# Patient Record
Sex: Female | Born: 1975 | Race: Black or African American | Hispanic: No | Marital: Single | Smoking: Never smoker
Health system: Southern US, Community
[De-identification: ages and names within clinical notes are randomized; demographics above are authoritative.]

## PROBLEM LIST (undated history)

## (undated) DIAGNOSIS — B009 Herpesviral infection, unspecified: Secondary | ICD-10-CM

## (undated) DIAGNOSIS — J45909 Unspecified asthma, uncomplicated: Secondary | ICD-10-CM

## (undated) HISTORY — PX: CERVIX LESION DESTRUCTION: SHX591

## (undated) HISTORY — PX: BUNIONECTOMY: SHX129

---

## 1997-10-25 ENCOUNTER — Emergency Department (HOSPITAL_COMMUNITY): Admission: EM | Admit: 1997-10-25 | Discharge: 1997-10-25 | Payer: Self-pay | Admitting: Emergency Medicine

## 1998-04-19 ENCOUNTER — Emergency Department (HOSPITAL_COMMUNITY): Admission: EM | Admit: 1998-04-19 | Discharge: 1998-04-19 | Payer: Self-pay | Admitting: Emergency Medicine

## 1998-04-27 ENCOUNTER — Ambulatory Visit (HOSPITAL_COMMUNITY): Admission: RE | Admit: 1998-04-27 | Discharge: 1998-04-27 | Payer: Self-pay

## 1998-05-25 ENCOUNTER — Other Ambulatory Visit: Admission: RE | Admit: 1998-05-25 | Discharge: 1998-05-25 | Payer: Self-pay | Admitting: *Deleted

## 1999-08-20 ENCOUNTER — Other Ambulatory Visit: Admission: RE | Admit: 1999-08-20 | Discharge: 1999-08-20 | Payer: Self-pay | Admitting: *Deleted

## 1999-08-20 ENCOUNTER — Encounter (INDEPENDENT_AMBULATORY_CARE_PROVIDER_SITE_OTHER): Payer: Self-pay

## 2000-10-05 ENCOUNTER — Other Ambulatory Visit: Admission: RE | Admit: 2000-10-05 | Discharge: 2000-10-05 | Payer: Self-pay | Admitting: *Deleted

## 2000-10-27 ENCOUNTER — Other Ambulatory Visit: Admission: RE | Admit: 2000-10-27 | Discharge: 2000-10-27 | Payer: Self-pay | Admitting: *Deleted

## 2001-03-08 ENCOUNTER — Other Ambulatory Visit: Admission: RE | Admit: 2001-03-08 | Discharge: 2001-03-08 | Payer: Self-pay | Admitting: *Deleted

## 2001-07-17 ENCOUNTER — Other Ambulatory Visit: Admission: RE | Admit: 2001-07-17 | Discharge: 2001-07-17 | Payer: Self-pay | Admitting: *Deleted

## 2002-08-19 ENCOUNTER — Other Ambulatory Visit: Admission: RE | Admit: 2002-08-19 | Discharge: 2002-08-19 | Payer: Self-pay | Admitting: *Deleted

## 2003-08-12 ENCOUNTER — Other Ambulatory Visit: Admission: RE | Admit: 2003-08-12 | Discharge: 2003-08-12 | Payer: Self-pay | Admitting: *Deleted

## 2004-08-07 ENCOUNTER — Emergency Department (HOSPITAL_COMMUNITY): Admission: EM | Admit: 2004-08-07 | Discharge: 2004-08-07 | Payer: Self-pay | Admitting: Emergency Medicine

## 2004-08-24 ENCOUNTER — Other Ambulatory Visit: Admission: RE | Admit: 2004-08-24 | Discharge: 2004-08-24 | Payer: Self-pay | Admitting: *Deleted

## 2005-08-31 ENCOUNTER — Other Ambulatory Visit: Admission: RE | Admit: 2005-08-31 | Discharge: 2005-08-31 | Payer: Self-pay | Admitting: *Deleted

## 2006-08-29 ENCOUNTER — Other Ambulatory Visit: Admission: RE | Admit: 2006-08-29 | Discharge: 2006-08-29 | Payer: Self-pay | Admitting: *Deleted

## 2007-08-13 ENCOUNTER — Other Ambulatory Visit: Admission: RE | Admit: 2007-08-13 | Discharge: 2007-08-13 | Payer: Self-pay | Admitting: *Deleted

## 2007-09-26 ENCOUNTER — Emergency Department (HOSPITAL_COMMUNITY): Admission: EM | Admit: 2007-09-26 | Discharge: 2007-09-26 | Payer: Self-pay | Admitting: Emergency Medicine

## 2012-07-11 NOTE — L&D Delivery Note (Signed)
Operative Delivery Note At 2:36 PM a viable female was delivered via Vaginal, Vacuum Investment banker, operational).  Presentation: vertex; Position: Occiput,, Anterior; Station: +3. VE indicated for bradycardia at the time of crowning   Verbal consent: obtained from patient.  Risks and benefits discussed in detail.  Risks include, but are not limited to the risks of anesthesia, bleeding, infection, damage to maternal tissues, fetal cephalhematoma.  There is also the risk of inability to effect vaginal delivery of the head, or shoulder dystocia that cannot be resolved by established maneuvers, leading to the need for emergency cesarean section.  APGAR: , ; weight .   Placenta status: , .  Spont>>intact Cord:  with the following complications:tight nuchal cord X 1, clamped + cut .  Cord pH: 7.22  Anesthesia:  Loc + epid Instruments: KIWI Episiotomy: none Lacerations: sec deg Suture Repair: 3.0 vicryl rapide Est. Blood Loss (mL): 300   NICU called to assist w/ infant resus  Mom to postpartum.  Baby to nursery-stable.  Meriel Pica 05/05/2013, 2:48 PM

## 2012-10-17 LAB — OB RESULTS CONSOLE ABO/RH: RH Type: POSITIVE

## 2012-10-17 LAB — OB RESULTS CONSOLE RUBELLA ANTIBODY, IGM: Rubella: IMMUNE

## 2012-10-17 LAB — OB RESULTS CONSOLE HEPATITIS B SURFACE ANTIGEN: Hepatitis B Surface Ag: NEGATIVE

## 2012-10-17 LAB — OB RESULTS CONSOLE HIV ANTIBODY (ROUTINE TESTING): HIV: NONREACTIVE

## 2012-10-17 LAB — OB RESULTS CONSOLE ANTIBODY SCREEN: Antibody Screen: NEGATIVE

## 2013-05-05 ENCOUNTER — Inpatient Hospital Stay (HOSPITAL_COMMUNITY)
Admission: AD | Admit: 2013-05-05 | Discharge: 2013-05-07 | DRG: 775 | Disposition: A | Discharge status: Home / Self Care | Payer: 59 | Source: Ambulatory Visit | Attending: Obstetrics and Gynecology | Admitting: Obstetrics and Gynecology

## 2013-05-05 ENCOUNTER — Inpatient Hospital Stay (HOSPITAL_COMMUNITY): Payer: 59 | Admitting: Anesthesiology

## 2013-05-05 ENCOUNTER — Encounter (HOSPITAL_COMMUNITY): Payer: 59 | Admitting: Anesthesiology

## 2013-05-05 ENCOUNTER — Encounter (HOSPITAL_COMMUNITY): Payer: Self-pay | Admitting: *Deleted

## 2013-05-05 DIAGNOSIS — O09519 Supervision of elderly primigravida, unspecified trimester: Secondary | ICD-10-CM | POA: Diagnosis present

## 2013-05-05 DIAGNOSIS — Z22330 Carrier of Group B streptococcus: Secondary | ICD-10-CM

## 2013-05-05 DIAGNOSIS — O99892 Other specified diseases and conditions complicating childbirth: Secondary | ICD-10-CM | POA: Diagnosis present

## 2013-05-05 HISTORY — DX: Unspecified asthma, uncomplicated: J45.909

## 2013-05-05 HISTORY — DX: Herpesviral infection, unspecified: B00.9

## 2013-05-05 LAB — CBC
HCT: 35.4 % — ABNORMAL LOW (ref 36.0–46.0)
Hemoglobin: 12.3 g/dL (ref 12.0–15.0)
MCH: 31.9 pg (ref 26.0–34.0)
MCHC: 34.7 g/dL (ref 30.0–36.0)
MCV: 91.9 fL (ref 78.0–100.0)
Platelets: 339 10*3/uL (ref 150–400)
RDW: 13.4 % (ref 11.5–15.5)
WBC: 10.4 10*3/uL (ref 4.0–10.5)

## 2013-05-05 LAB — COMPREHENSIVE METABOLIC PANEL
ALT: 9 U/L (ref 0–35)
AST: 16 U/L (ref 0–37)
Albumin: 2.9 g/dL — ABNORMAL LOW (ref 3.5–5.2)
Alkaline Phosphatase: 142 U/L — ABNORMAL HIGH (ref 39–117)
BUN: 7 mg/dL (ref 6–23)
Calcium: 9.3 mg/dL (ref 8.4–10.5)
Potassium: 3.5 mEq/L (ref 3.5–5.1)
Sodium: 135 mEq/L (ref 135–145)
Total Bilirubin: 0.3 mg/dL (ref 0.3–1.2)
Total Protein: 6.9 g/dL (ref 6.0–8.3)

## 2013-05-05 LAB — TYPE AND SCREEN: ABO/RH(D): B POS

## 2013-05-05 LAB — ABO/RH: ABO/RH(D): B POS

## 2013-05-05 MED ORDER — LIDOCAINE HCL (PF) 1 % IJ SOLN
INTRAMUSCULAR | Status: DC | PRN
  Administered 2013-05-05 (×2): 9 mL

## 2013-05-05 MED ORDER — EPHEDRINE 5 MG/ML INJ
10.0000 mg | INTRAVENOUS | Status: DC | PRN
  Filled 2013-05-05: qty 2
  Filled 2013-05-05: qty 4

## 2013-05-05 MED ORDER — WITCH HAZEL-GLYCERIN EX PADS: 1.0000 "application " | MEDICATED_PAD | CUTANEOUS | Status: DC | PRN

## 2013-05-05 MED ORDER — OXYTOCIN 40 UNITS IN LACTATED RINGERS INFUSION - SIMPLE MED
1.0000 m[IU]/min | INTRAVENOUS | Status: DC
  Administered 2013-05-05: 1 m[IU]/min via INTRAVENOUS
  Filled 2013-05-05: qty 1000

## 2013-05-05 MED ORDER — PENICILLIN G POTASSIUM 5000000 UNITS IJ SOLR
5.0000 10*6.[IU] | Freq: Once | INTRAVENOUS | Status: AC
  Administered 2013-05-05: 5 10*6.[IU] via INTRAVENOUS
  Filled 2013-05-05: qty 5

## 2013-05-05 MED ORDER — DIPHENHYDRAMINE HCL 25 MG PO CAPS: 25.0000 mg | ORAL_CAPSULE | Freq: Four times a day (QID) | ORAL | Status: DC | PRN

## 2013-05-05 MED ORDER — DIPHENHYDRAMINE HCL 50 MG/ML IJ SOLN: 12.5000 mg | INTRAMUSCULAR | Status: DC | PRN

## 2013-05-05 MED ORDER — FENTANYL 2.5 MCG/ML BUPIVACAINE 1/10 % EPIDURAL INFUSION (WH - ANES)
INTRAMUSCULAR | Status: DC | PRN
  Administered 2013-05-05: 14 mL/h via EPIDURAL

## 2013-05-05 MED ORDER — PHENYLEPHRINE 40 MCG/ML (10ML) SYRINGE FOR IV PUSH (FOR BLOOD PRESSURE SUPPORT)
80.0000 ug | PREFILLED_SYRINGE | INTRAVENOUS | Status: DC | PRN
  Filled 2013-05-05: qty 2
  Filled 2013-05-05: qty 5

## 2013-05-05 MED ORDER — MEASLES, MUMPS & RUBELLA VAC ~~LOC~~ INJ
0.5000 mL | INJECTION | Freq: Once | SUBCUTANEOUS | Status: DC
  Filled 2013-05-05: qty 0.5

## 2013-05-05 MED ORDER — SIMETHICONE 80 MG PO CHEW: 80.0000 mg | CHEWABLE_TABLET | ORAL | Status: DC | PRN

## 2013-05-05 MED ORDER — OXYCODONE-ACETAMINOPHEN 5-325 MG PO TABS: 1.0000 | ORAL_TABLET | ORAL | Status: DC | PRN

## 2013-05-05 MED ORDER — BISACODYL 10 MG RE SUPP: 10.0000 mg | Freq: Every day | RECTAL | Status: DC | PRN

## 2013-05-05 MED ORDER — LANOLIN HYDROUS EX OINT: TOPICAL_OINTMENT | CUTANEOUS | Status: DC | PRN

## 2013-05-05 MED ORDER — LIDOCAINE HCL (PF) 1 % IJ SOLN
30.0000 mL | INTRAMUSCULAR | Status: DC | PRN
  Administered 2013-05-05: 30 mL via SUBCUTANEOUS
  Filled 2013-05-05 (×2): qty 30

## 2013-05-05 MED ORDER — EPHEDRINE 5 MG/ML INJ
10.0000 mg | INTRAVENOUS | Status: DC | PRN
  Filled 2013-05-05: qty 2

## 2013-05-05 MED ORDER — ACETAMINOPHEN 325 MG PO TABS: 650.0000 mg | ORAL_TABLET | ORAL | Status: DC | PRN

## 2013-05-05 MED ORDER — PHENYLEPHRINE 40 MCG/ML (10ML) SYRINGE FOR IV PUSH (FOR BLOOD PRESSURE SUPPORT)
80.0000 ug | PREFILLED_SYRINGE | INTRAVENOUS | Status: DC | PRN
  Filled 2013-05-05: qty 2

## 2013-05-05 MED ORDER — SENNOSIDES-DOCUSATE SODIUM 8.6-50 MG PO TABS
2.0000 | ORAL_TABLET | ORAL | Status: DC
  Administered 2013-05-06 – 2013-05-07 (×2): 2 via ORAL
  Filled 2013-05-05 (×2): qty 2

## 2013-05-05 MED ORDER — ONDANSETRON HCL 4 MG/2ML IJ SOLN: 4.0000 mg | INTRAMUSCULAR | Status: DC | PRN

## 2013-05-05 MED ORDER — LACTATED RINGERS IV SOLN: 500.0000 mL | INTRAVENOUS | Status: DC | PRN

## 2013-05-05 MED ORDER — TETANUS-DIPHTH-ACELL PERTUSSIS 5-2.5-18.5 LF-MCG/0.5 IM SUSP: 0.5000 mL | Freq: Once | INTRAMUSCULAR | Status: DC

## 2013-05-05 MED ORDER — PENICILLIN G POTASSIUM 5000000 UNITS IJ SOLR
2.5000 10*6.[IU] | INTRAMUSCULAR | Status: DC
  Administered 2013-05-05: 2.5 10*6.[IU] via INTRAVENOUS
  Filled 2013-05-05 (×5): qty 2.5

## 2013-05-05 MED ORDER — IBUPROFEN 800 MG PO TABS
800.0000 mg | ORAL_TABLET | Freq: Three times a day (TID) | ORAL | Status: DC | PRN
  Administered 2013-05-05 – 2013-05-07 (×4): 800 mg via ORAL
  Filled 2013-05-05 (×4): qty 1

## 2013-05-05 MED ORDER — DIBUCAINE 1 % RE OINT: 1.0000 "application " | TOPICAL_OINTMENT | RECTAL | Status: DC | PRN

## 2013-05-05 MED ORDER — PRENATAL MULTIVITAMIN CH
1.0000 | ORAL_TABLET | Freq: Every day | ORAL | Status: DC
  Administered 2013-05-06: 1 via ORAL
  Filled 2013-05-05: qty 1

## 2013-05-05 MED ORDER — ZOLPIDEM TARTRATE 5 MG PO TABS: 5.0000 mg | ORAL_TABLET | Freq: Every evening | ORAL | Status: DC | PRN

## 2013-05-05 MED ORDER — IBUPROFEN 600 MG PO TABS: 600.0000 mg | ORAL_TABLET | Freq: Four times a day (QID) | ORAL | Status: DC | PRN

## 2013-05-05 MED ORDER — FLEET ENEMA 7-19 GM/118ML RE ENEM: 1.0000 | ENEMA | RECTAL | Status: DC | PRN

## 2013-05-05 MED ORDER — OXYTOCIN BOLUS FROM INFUSION: 500.0000 mL | INTRAVENOUS | Status: DC

## 2013-05-05 MED ORDER — CITRIC ACID-SODIUM CITRATE 334-500 MG/5ML PO SOLN: 30.0000 mL | ORAL | Status: DC | PRN

## 2013-05-05 MED ORDER — BENZOCAINE-MENTHOL 20-0.5 % EX AERO
1.0000 "application " | INHALATION_SPRAY | CUTANEOUS | Status: DC | PRN
  Administered 2013-05-05: 1 via TOPICAL
  Filled 2013-05-05: qty 56

## 2013-05-05 MED ORDER — ONDANSETRON HCL 4 MG/2ML IJ SOLN: 4.0000 mg | Freq: Four times a day (QID) | INTRAMUSCULAR | Status: DC | PRN

## 2013-05-05 MED ORDER — OXYCODONE-ACETAMINOPHEN 5-325 MG PO TABS: 1.0000 | ORAL_TABLET | Freq: Four times a day (QID) | ORAL | Status: DC | PRN

## 2013-05-05 MED ORDER — FENTANYL 2.5 MCG/ML BUPIVACAINE 1/10 % EPIDURAL INFUSION (WH - ANES)
14.0000 mL/h | INTRAMUSCULAR | Status: DC | PRN
  Filled 2013-05-05 (×2): qty 125

## 2013-05-05 MED ORDER — LACTATED RINGERS IV SOLN
INTRAVENOUS | Status: DC
  Administered 2013-05-05: 05:00:00 via INTRAVENOUS

## 2013-05-05 MED ORDER — LACTATED RINGERS IV SOLN
500.0000 mL | Freq: Once | INTRAVENOUS | Status: AC
  Administered 2013-05-05: 1000 mL via INTRAVENOUS

## 2013-05-05 MED ORDER — FLEET ENEMA 7-19 GM/118ML RE ENEM: 1.0000 | ENEMA | Freq: Every day | RECTAL | Status: DC | PRN

## 2013-05-05 MED ORDER — OXYTOCIN 40 UNITS IN LACTATED RINGERS INFUSION - SIMPLE MED
62.5000 mL/h | INTRAVENOUS | Status: DC
Start: 2013-05-05 — End: 2013-05-05

## 2013-05-05 MED ORDER — ONDANSETRON HCL 4 MG PO TABS: 4.0000 mg | ORAL_TABLET | ORAL | Status: DC | PRN

## 2013-05-05 MED ORDER — TERBUTALINE SULFATE 1 MG/ML IJ SOLN: 0.2500 mg | Freq: Once | INTRAMUSCULAR | Status: DC | PRN

## 2013-05-05 NOTE — Progress Notes (Signed)
Now 6-7/ AROM>>>clear, FHR cat 1

## 2013-05-05 NOTE — Progress Notes (Signed)
Report called to Dana RN in BS. Pt to BS via w/c 

## 2013-05-05 NOTE — Progress Notes (Signed)
Dr Marcelle Overlie notified of pt's admission and status. 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 Orders received.

## 2013-05-05 NOTE — H&P (Signed)
Mallory Pham is a 37 y.o. female presenting for SOL. Maternal Medical History:  Reason for admission: Contractions.   Contractions: Onset was 6-12 hours ago.   Frequency: regular.   Perceived severity is moderate.    Fetal activity: Perceived fetal activity is normal.   Last perceived fetal movement was within the past hour.      OB History   Grav Para Term Preterm Abortions TAB SAB Ect Mult Living   1              Past Medical History  Diagnosis Date  . Asthma   . Herpes    Past Surgical History  Procedure Laterality Date  . Cervix lesion destruction    . Bunionectomy      Left foot   Family History: family history includes Cancer in her maternal grandmother; Heart disease in her maternal aunt. Social History:  reports that she has never smoked. She does not have any smokeless tobacco history on file. She reports that she does not drink alcohol or use illicit drugs.   Prenatal Transfer Tool  Maternal Diabetes: No Genetic Screening: Normal Maternal Ultrasounds/Referrals: Normal Fetal Ultrasounds or other Referrals:  None Maternal Substance Abuse:  No Significant Maternal Medications:  None Significant Maternal Lab Results:  None Other Comments:  None  ROS  Dilation: 6.5 Effacement (%): 100 Station: 0 Exam by:: Felkelrn Blood pressure 146/94, pulse 89, temperature 98.4 F (36.9 C), temperature source Oral, resp. rate 20, height 5\' 2"  (1.575 m), weight 218 lb 12.8 oz (99.247 kg), SpO2 98.00%. Maternal Exam:  Uterine Assessment: Contraction strength is moderate.  Contraction frequency is regular.   Abdomen: Patient reports no abdominal tenderness. Fundal height is term FH.   Estimated fetal weight is AGA.   Fetal presentation: vertex  Introitus: Normal vulva. Normal vagina.  Pelvis: adequate for delivery.      Physical Exam  Constitutional: She is oriented to person, place, and time. She appears well-developed and well-nourished.  HENT:  Head:  Normocephalic and atraumatic.  Neck: Normal range of motion. Neck supple.  Cardiovascular: Normal rate and regular rhythm.   Respiratory: Effort normal and breath sounds normal.  GI:  Term FH, FHR 138  Genitourinary:  Vulva NEG, cx 3-4 on adm  Musculoskeletal: Normal range of motion.  Neurological: She is alert and oriented to person, place, and time.    Prenatal labs: ABO, Rh: --/--/B POS (10/26 0500) Antibody: NEG (10/26 0500) Rubella: Immune (04/09 0000) RPR: Nonreactive (04/09 0000)  HBsAg: Negative (04/09 0000)  HIV: Non-reactive (04/09 0000)  GBS: Positive (10/03 0000)   Assessment/Plan: Term IUP, SOL, past hx HSV, on daily Valtrex, no current sx, hx + GBS>>antibiotics   Cassian Torelli M 05/05/2013, 9:01 AM

## 2013-05-05 NOTE — MAU Note (Signed)
Pt states has been on Keflex for small area L middle finger that came to head and burst and then would not heal. Pt has bandaid on finger.

## 2013-05-05 NOTE — Anesthesia Procedure Notes (Signed)
Epidural Patient location during procedure: OB Start time: 05/05/2013 6:30 AM End time: 05/05/2013 6:34 AM  Staffing Anesthesiologist: Leilani Able  Preanesthetic Checklist Completed: patient identified, surgical consent, pre-op evaluation, timeout performed, IV checked, risks and benefits discussed and monitors and equipment checked  Epidural Patient position: sitting Prep: site prepped and draped and DuraPrep Patient monitoring: continuous pulse ox and blood pressure Approach: midline Injection technique: LOR air  Needle:  Needle type: Tuohy  Needle gauge: 17 G Needle length: 9 cm and 9 Needle insertion depth: 7 cm Catheter type: closed end flexible Catheter size: 19 Gauge Catheter at skin depth: 13 cm Test dose: negative and Other  Assessment Sensory level: T9 Events: blood not aspirated, injection not painful, no injection resistance, negative IV test and no paresthesia  Additional Notes Reason for block:procedure for pain

## 2013-05-05 NOTE — MAU Note (Signed)
Contractions since 2400. Some brownish d/c

## 2013-05-05 NOTE — Anesthesia Preprocedure Evaluation (Signed)
Anesthesia Evaluation  Patient identified by MRN, date of birth, ID band Patient awake    Reviewed: Allergy & Precautions, H&P , NPO status , Patient's Chart, lab work & pertinent test results  Airway Mallampati: II TM Distance: >3 FB Neck ROM: full    Dental no notable dental hx.    Pulmonary    Pulmonary exam normal       Cardiovascular negative cardio ROS      Neuro/Psych negative neurological ROS  negative psych ROS   GI/Hepatic negative GI ROS, Neg liver ROS,   Endo/Other  Morbid obesity  Renal/GU negative Renal ROS     Musculoskeletal   Abdominal (+) + obese,   Peds  Hematology negative hematology ROS (+)   Anesthesia Other Findings   Reproductive/Obstetrics (+) Pregnancy                           Anesthesia Physical Anesthesia Plan  ASA: III  Anesthesia Plan: Epidural   Post-op Pain Management:    Induction:   Airway Management Planned:   Additional Equipment:   Intra-op Plan:   Post-operative Plan:   Informed Consent: I have reviewed the patients History and Physical, chart, labs and discussed the procedure including the risks, benefits and alternatives for the proposed anesthesia with the patient or authorized representative who has indicated his/her understanding and acceptance.     Plan Discussed with:   Anesthesia Plan Comments:         Anesthesia Quick Evaluation  

## 2013-05-06 LAB — CBC
MCH: 31.8 pg (ref 26.0–34.0)
MCV: 92.4 fL (ref 78.0–100.0)
Platelets: 309 10*3/uL (ref 150–400)
RBC: 3.3 MIL/uL — ABNORMAL LOW (ref 3.87–5.11)

## 2013-05-06 NOTE — Progress Notes (Signed)
Post Partum Day 1 Subjective: no complaints, up ad lib, voiding and tolerating PO  Objective: Blood pressure 127/86, pulse 75, temperature 98.1 F (36.7 C), temperature source Oral, resp. rate 18, height 5\' 2"  (1.575 m), weight 218 lb 12.8 oz (99.247 kg), SpO2 84.00%, unknown if currently breastfeeding.  Physical Exam:  General: alert and cooperative Lochia: appropriate Uterine Fundus: firm Incision: perineum intact, no labial edema noted DVT Evaluation: No evidence of DVT seen on physical exam. Negative Homan's sign. No cords or calf tenderness.   Recent Labs  05/05/13 0500 05/06/13 0605  HGB 12.3 10.5*  HCT 35.4* 30.5*    Assessment/Plan: Plan for discharge tomorrow   LOS: 1 day   Anjana Cheek G 05/06/2013, 8:23 AM

## 2013-05-06 NOTE — Lactation Note (Signed)
This note was copied from the chart of [facility]. Lactation Consultation Note  Mom reports breastfeeding is going well.  Reviewed basics, mom knows to re latch baby if nipple hurts. Teach back regarding feeding cues and proper latch done.  Hand expression demonstrated with colostrum visible. Mom to call for help as needed.   Patient Name: Mallory Pham [id number] Date: 05/06/2013 Reason for consult: Follow-up assessment   Maternal Data    Feeding Feeding Type: Breast Fed Length of feed: 30 min  LATCH Score/Interventions Latch: Grasps breast easily, tongue down, lips flanged, rhythmical sucking.  Audible Swallowing: A few with stimulation  Type of Nipple: Everted at rest and after stimulation  Comfort (Breast/Nipple): Soft / non-tender     Hold (Positioning): No assistance needed to correctly position infant at breast.  LATCH Score: 9  Lactation Tools Discussed/Used     Consult Status Consult Status: Follow-up Date: 05/07/13 Follow-up type: In-patient    Tricia Pledger, Arvella Merles 05/06/2013, 5:46 PM

## 2013-05-06 NOTE — Anesthesia Postprocedure Evaluation (Signed)
  Anesthesia Post-op Note  Patient: Mallory Pham  Procedure(s) Performed: * No procedures listed *  Patient Location: PACU and Mother/Baby  Anesthesia Type:Epidural  Level of Consciousness: awake, alert  and oriented  Airway and Oxygen Therapy: Patient Spontanous Breathing  Post-op Pain: none  Post-op Assessment: Post-op Vital signs reviewed, Patient's Cardiovascular Status Stable, No headache, No backache, No residual numbness and No residual motor weakness  Post-op Vital Signs: Reviewed and stable  Complications: No apparent anesthesia complications

## 2013-05-07 MED ORDER — IBUPROFEN 800 MG PO TABS: 800.0000 mg | ORAL_TABLET | Freq: Three times a day (TID) | ORAL | Status: DC | PRN

## 2013-05-07 NOTE — Progress Notes (Signed)
UR chart review completed.  

## 2013-05-07 NOTE — Discharge Summary (Signed)
Obstetric Discharge Summary Reason for Admission: onset of labor Prenatal Procedures: ultrasound Intrapartum Procedures: vacuum Postpartum Procedures: none Complications-Operative and Postpartum: 2 degree perineal laceration Hemoglobin  Date Value Range Status  05/06/2013 10.5* 12.0 - 15.0 g/dL Final     HCT  Date Value Range Status  05/06/2013 30.5* 36.0 - 46.0 % Final    Physical Exam:  General: alert and cooperative Lochia: appropriate Uterine Fundus: firm Incision: perineum intact DVT Evaluation: No evidence of DVT seen on physical exam. Negative Homan's sign. No cords or calf tenderness. Calf/Ankle edema is present.  Discharge Diagnoses: Term Pregnancy-delivered  Discharge Information: Date: 05/07/2013 Activity: pelvic rest Diet: routine Medications: PNV, Ibuprofen and keflex for digit laceration Condition: stable Instructions: refer to practice specific booklet Discharge to: home   Newborn Data: Live born female  Birth Weight: 6 lb 4.5 oz (2850 g) APGAR: 4, 8  Home with mother.  Hargun Spurling G 05/07/2013, 7:51 AM

## 2014-05-12 ENCOUNTER — Encounter (HOSPITAL_COMMUNITY): Payer: Self-pay | Admitting: *Deleted

## 2015-06-08 DIAGNOSIS — L658 Other specified nonscarring hair loss: Secondary | ICD-10-CM | POA: Insufficient documentation

## 2015-06-08 DIAGNOSIS — Z87898 Personal history of other specified conditions: Secondary | ICD-10-CM | POA: Insufficient documentation

## 2015-06-08 DIAGNOSIS — Z872 Personal history of diseases of the skin and subcutaneous tissue: Secondary | ICD-10-CM | POA: Insufficient documentation

## 2018-03-09 ENCOUNTER — Ambulatory Visit (INDEPENDENT_AMBULATORY_CARE_PROVIDER_SITE_OTHER): Payer: 59

## 2018-03-09 ENCOUNTER — Encounter: Payer: Self-pay | Admitting: Podiatry

## 2018-03-09 ENCOUNTER — Ambulatory Visit (INDEPENDENT_AMBULATORY_CARE_PROVIDER_SITE_OTHER): Payer: 59 | Admitting: Podiatry

## 2018-03-09 DIAGNOSIS — L853 Xerosis cutis: Secondary | ICD-10-CM

## 2018-03-09 DIAGNOSIS — M779 Enthesopathy, unspecified: Secondary | ICD-10-CM | POA: Diagnosis not present

## 2018-03-09 DIAGNOSIS — M21619 Bunion of unspecified foot: Secondary | ICD-10-CM | POA: Diagnosis not present

## 2018-03-09 MED ORDER — AMMONIUM LACTATE 12 % EX CREA: TOPICAL_CREAM | CUTANEOUS | 0 refills | Status: AC | PRN

## 2018-03-09 NOTE — Patient Instructions (Signed)

## 2018-03-09 NOTE — Progress Notes (Signed)
   Subjective:    Patient ID: Mallory Pham, female    DOB: 10/26/1975, 42 y.o.   MRN: [id number]  HPI  42 year old female presents the office today for concerns of bunion on the right foot.  She had a left foot bunion surgery done about 10 years ago.  The area has been ongoing pain for some time she states that starting to pop more.  She states that hurts with certain shoes and she is tried changing shoes as well as inserts.  No other recent treatment that she recalls no recent injury or trauma.  She occasionally has some sharp pain at nighttime to the area.  She is also mostly dry, thick skin to her heels.  Not sure if this is athlete's foot.  No other concerns.  Review of Systems  All other systems reviewed and are negative.  Past Medical History:  Diagnosis Date  . Asthma   . Herpes     Past Surgical History:  Procedure Laterality Date  . BUNIONECTOMY     Left foot  . CERVIX LESION DESTRUCTION       Current Outpatient Medications:  .  ammonium lactate (AMLACTIN) 12 % cream, Apply topically as needed for dry skin., Disp: 385 g, Rfl: 0  No Known Allergies       Objective:   Physical Exam  General: AAO x3, NAD  Dermatological: Dry skin present to the bilateral heels without any skin fissures.  No significant tinea pedis is present she denies any itching.  No open lesions.  Vascular: Dorsalis Pedis artery and Posterior Tibial artery pedal pulses are 2/4 bilateral with immedate capillary fill time.  There is no pain with calf compression, swelling, warmth, erythema.   Neruologic: Grossly intact via light touch bilateral. Protective threshold with Semmes Wienstein monofilament intact to all pedal sites bilateral.   Musculoskeletal: Moderate bunion deformities present right foot.  Mild discomfort in the medial aspect of first metatarsal head.  No pain or crepitation with MPJ range of motion.  There first ray hypermobility is present.  Muscular strength 5/5 in all groups tested  bilateral.  Gait: Unassisted, Nonantalgic.     Assessment & Plan:  42 year old female right foot bunion, dry skin -Treatment options discussed including all alternatives, risks, and complications -Etiology of symptoms were discussed -We discussed with conservative as well as surgical options.  Certainly discussed shoe modifications, offloading, orthotics, pads.  We also discussed surgical options but he possibly has been screw fixation.  She is to continue conservative treatment now and she do not think about surgery and she will let us know if she wants to proceed with this in the future. -Prescribed AmLactin for dry skin.  Trula Slade DPM

## 2018-05-16 ENCOUNTER — Telehealth: Payer: Self-pay | Admitting: *Deleted

## 2018-05-16 NOTE — Telephone Encounter (Signed)
"  I'm a patient of Dr. Jacqualyn Posey.  He said I needed to have surgery.  I just need to know how long it will be before I could drive.  The surgery will be on my right foot."  It takes six to eight weeks for actual bone healing.  You may not be able to drive for three to four weeks.  "Okay, can I go ahead and schedule for next year?"  You're going to need an appointment for a consultation with Dr. Jacqualyn Posey.  Once you come in to see him we can get you scheduled.  "Can you schedule that appointment or do I need to speak to the regular schedulers?"  You need to speak to the regular scheduler.  I will transfer you.  I transferred her to [place].

## 2018-05-18 ENCOUNTER — Ambulatory Visit (INDEPENDENT_AMBULATORY_CARE_PROVIDER_SITE_OTHER): Payer: 59 | Admitting: Podiatry

## 2018-05-18 ENCOUNTER — Encounter: Payer: Self-pay | Admitting: Podiatry

## 2018-05-18 DIAGNOSIS — M21619 Bunion of unspecified foot: Secondary | ICD-10-CM

## 2018-05-18 NOTE — Patient Instructions (Signed)
Pre-Operative Instructions  Congratulations, you have decided to take an important step towards improving your quality of life.  You can be assured that the doctors and staff at [facility] will be with you every step of the way.  Here are some important things you should know:  1. Plan to be at the surgery center/hospital at least 1 (one) hour prior to your scheduled time, unless otherwise directed by the surgical center/hospital staff.  You must have a responsible adult accompany you, remain during the surgery and drive you home.  Make sure you have directions to the surgical center/hospital to ensure you arrive on time. 2. If you are having surgery at [facility] or [facility] hospitals, you will need a copy of your medical history and physical form from your family physician within one month prior to the date of surgery. We will give you a form for your primary physician to complete.  3. We make every effort to accommodate the date you request for surgery.  However, there are times where surgery dates or times have to be moved.  We will contact you as soon as possible if a change in schedule is required.   4. No aspirin/ibuprofen for one week before surgery.  If you are on aspirin, any non-steroidal anti-inflammatory medications (Mobic, Aleve, Ibuprofen) should not be taken seven (7) days prior to your surgery.  You make take Tylenol for pain prior to surgery.  5. Medications - If you are taking daily heart and blood pressure medications, seizure, reflux, allergy, asthma, anxiety, pain or diabetes medications, make sure you notify the surgery center/hospital before the day of surgery so they can tell you which medications you should take or avoid the day of surgery. 6. No food or drink after midnight the night before surgery unless directed otherwise by surgical center/hospital staff. 7. No alcoholic beverages 24-hours prior to surgery.  No smoking 24-hours prior or 24-hours after  surgery. 8. Wear loose pants or shorts. They should be loose enough to fit over bandages, boots, and casts. 9. Don't wear slip-on shoes. Sneakers are preferred. 10. Bring your boot with you to the surgery center/hospital.  Also bring crutches or a walker if your physician has prescribed it for you.  If you do not have this equipment, it will be provided for you after surgery. 11. If you have not been contacted by the surgery center/hospital by the day before your surgery, call to confirm the date and time of your surgery. 12. Leave-time from work may vary depending on the type of surgery you have.  Appropriate arrangements should be made prior to surgery with your employer. 13. Prescriptions will be provided immediately following surgery by your doctor.  Fill these as soon as possible after surgery and take the medication as directed. Pain medications will not be refilled on weekends and must be approved by the doctor. 14. Remove nail polish on the operative foot and avoid getting pedicures prior to surgery. 15. Wash the night before surgery.  The night before surgery wash the foot and leg well with water and the antibacterial soap provided. Be sure to pay special attention to beneath the toenails and in between the toes.  Wash for at least three (3) minutes. Rinse thoroughly with water and dry well with a towel.  Perform this wash unless told not to do so by your physician.  Enclosed: 1 Ice pack (please put in freezer the night before surgery)   1 Hibiclens skin cleaner     Pre-op instructions  If you have any questions regarding the instructions, please do not hesitate to call our office.  [place]: [address], [place], [place] [address] -- [phone]  [place]: [address] [place], [place] [address] -- [phone]  [place]: [address]  [place], [place] [address] -- [phone]  [place]: [address], [address], [place], [place] [address] -- [phone]  Website:  [email or web address] 

## 2018-05-21 NOTE — Progress Notes (Signed)
Subjective: 42 year old female presents the office today for surgical consultation due to ongoing pain to the right bunion deformity.  She states that she has ongoing pain on the bunion at this point she is tried conservative treatment including shoe modifications, offloading, padding without any significant improvement she was to proceed with surgery to help correct the bunion deformity. Denies any systemic complaints such as fevers, chills, nausea, vomiting. No acute changes since last appointment, and no other complaints at this time.   Objective: AAO x3, NAD DP/PT pulses palpable bilaterally, CRT less than 3 seconds Moderate bunion deformities present in the right foot.  There is tenderness around the bunion site.  There is no pain or crepitation first MPJ range of motion.  There is no first ray hypermobility present.  Mild tenderness palpation of the bunion site.  No other areas of tenderness identified today. No open lesions or pre-ulcerative lesions.  No pain with calf compression, swelling, warmth, erythema  Assessment: Symptomatic right bunion deformity  Plan: -All treatment options discussed with the patient including all alternatives, risks, complications.  -I reviewed the x-rays with her and we discussed both conservative as well as surgical treatment options.  At this point she wants proceed with surgical intervention.  We discussed [place] bunionectomy with screw fixation.  We discussed the surgery as well as postoperative course.  At this time she wants to go him proceed after discussion. -The incision placement as well as the postoperative course was discussed with the patient. I discussed risks of the surgery which include, but not limited to, infection, bleeding, pain, swelling, need for further surgery, delayed or nonhealing, painful or ugly scar, numbness or sensation changes, over/under correction, recurrence, transfer lesions, further deformity, hardware failure, DVT/PE, loss of  toe/foot. Patient understands these risks and wishes to proceed with surgery. The surgical consent was reviewed with the patient all 3 pages were signed. No promises or guarantees were given to the outcome of the procedure. All questions were answered to the best of my ability. Before the surgery the patient was encouraged to call the office if there is any further questions. The surgery will be performed at the [facility] on an outpatient basis. -Will plan for January 09, 2019.  I will see her back prior to surgery for preoperative appointment -Patient encouraged to call the office with any questions, concerns, change in symptoms.   *X-rays next appointment  Trula Slade DPM

## 2018-10-16 ENCOUNTER — Other Ambulatory Visit: Payer: Self-pay | Admitting: Obstetrics and Gynecology

## 2018-10-16 DIAGNOSIS — R928 Other abnormal and inconclusive findings on diagnostic imaging of breast: Secondary | ICD-10-CM

## 2018-11-05 ENCOUNTER — Ambulatory Visit
Admission: RE | Admit: 2018-11-05 | Discharge: 2018-11-05 | Disposition: A | Discharge status: Home / Self Care | Payer: 59 | Source: Ambulatory Visit | Attending: Obstetrics and Gynecology | Admitting: Obstetrics and Gynecology

## 2018-11-05 ENCOUNTER — Other Ambulatory Visit: Payer: Self-pay

## 2018-11-05 ENCOUNTER — Other Ambulatory Visit: Payer: Self-pay | Admitting: Obstetrics and Gynecology

## 2018-11-05 DIAGNOSIS — N6310 Unspecified lump in the right breast, unspecified quadrant: Secondary | ICD-10-CM

## 2018-11-05 DIAGNOSIS — R928 Other abnormal and inconclusive findings on diagnostic imaging of breast: Secondary | ICD-10-CM

## 2018-11-08 ENCOUNTER — Other Ambulatory Visit: Payer: 59

## 2018-11-09 ENCOUNTER — Ambulatory Visit
Admission: RE | Admit: 2018-11-09 | Discharge: 2018-11-09 | Disposition: A | Discharge status: Home / Self Care | Payer: 59 | Source: Ambulatory Visit | Attending: Obstetrics and Gynecology | Admitting: Obstetrics and Gynecology

## 2018-11-09 ENCOUNTER — Other Ambulatory Visit: Payer: Self-pay

## 2018-11-09 DIAGNOSIS — N6310 Unspecified lump in the right breast, unspecified quadrant: Secondary | ICD-10-CM

## 2018-11-09 HISTORY — PX: BREAST BIOPSY: SHX20

## 2018-11-29 ENCOUNTER — Telehealth: Payer: Self-pay | Admitting: *Deleted

## 2018-11-29 NOTE — Telephone Encounter (Signed)
I left her a voicemail letting her know we do have her scheduled for January 09, 2019.

## 2018-11-29 NOTE — Telephone Encounter (Signed)
"  I was calling to see if my surgery is still scheduled with Dr. Jacqualyn Posey on July 1."

## 2018-12-19 ENCOUNTER — Telehealth: Payer: Self-pay | Admitting: *Deleted

## 2018-12-19 NOTE — Telephone Encounter (Signed)
"  I was calling concerning my surgery that's scheduled for July 1.  I was calling to see if it's possible to schedule it for a later date.  If so, how far down the line will that be.  Please give me a call."

## 2018-12-20 NOTE — Telephone Encounter (Signed)
I'm returning your call.  You want to reschedule your surgery?  "No, not now, I just had a question but I'm okay now.  I was wanting to see what his next available date was but I'm okay now.  Thanks for calling me back."

## 2018-12-28 ENCOUNTER — Telehealth: Payer: Self-pay | Admitting: *Deleted

## 2018-12-28 NOTE — Telephone Encounter (Signed)
"  I'm scheduled for surgery on July 1.  I haven't been told anything about my arrival time."  Someone from the surgical center will give you a call a day or two prior to your surgery date and will give you your arrival time.  "I was wanting to know because I have to make arrangements for someone to go with me and to drive my car back home."  All I can tell yo is that it will be sometime that morning.  The reason I can't give you a time is because they may have cancellations or have children or Diabetic patients that they like to do first.  You can call them if you like.  "What's their phone number?"  Their phone number is on the back of the brochure that we gave you but if you don't have it on hand, the number is [phone].

## 2019-01-03 ENCOUNTER — Telehealth: Payer: Self-pay | Admitting: *Deleted

## 2019-01-03 NOTE — Telephone Encounter (Signed)
DOS 01/09/2019; 64332 - [place] BUNIONECTOMY RIGHT FOOT  [employer]: Effective Date - 07/11/2018 - 07/11/2019     Individual In-Network (Calendar Year) Deductible Deductible has been met  $0.00 remaining  $2,200.00 Plan Amt.   Out-of-Pocket $2,527.31 MET YTD  $3,972.69 remaining  $6,500.00 Plan Amt.   Professional Fees for Surgical and Medical Services 85% of eligible expenses after satisfying the deductible     Notification/Prior Authorization is required for one or more of the procedures you have entered. The notification/prior authorization reference number is [id number].

## 2019-01-04 NOTE — Telephone Encounter (Signed)
"  I was calling concerning the surgery on Wednesday that I have with Dr. Jacqualyn Posey.  Am I required to take a Covid test?  Noone has said anything about it.  I don have an appointment on Monday at 3:45 pm.  I didn't know if one would be done then or if I had to have one done.  Please give me a call back and leave me a message letting me know yes or no.  I'm returning your call.  The surgical center's nurse is performing the Covid test.  Give them a call and ask.  I think they are doing it the day of surgery.  "I was wondering because other places are doing the tests prior to surgeries."  Do you need their phone number?  "I think I have it, I called them the other day."

## 2019-01-07 ENCOUNTER — Ambulatory Visit (INDEPENDENT_AMBULATORY_CARE_PROVIDER_SITE_OTHER): Payer: 59

## 2019-01-07 ENCOUNTER — Ambulatory Visit (INDEPENDENT_AMBULATORY_CARE_PROVIDER_SITE_OTHER): Payer: 59 | Admitting: Podiatry

## 2019-01-07 ENCOUNTER — Other Ambulatory Visit: Payer: Self-pay

## 2019-01-07 ENCOUNTER — Telehealth: Payer: Self-pay | Admitting: Podiatry

## 2019-01-07 ENCOUNTER — Encounter: Payer: Self-pay | Admitting: Podiatry

## 2019-01-07 DIAGNOSIS — M2011 Hallux valgus (acquired), right foot: Secondary | ICD-10-CM

## 2019-01-07 DIAGNOSIS — M722 Plantar fascial fibromatosis: Secondary | ICD-10-CM

## 2019-01-07 MED ORDER — CEPHALEXIN 500 MG PO CAPS: 500.0000 mg | ORAL_CAPSULE | Freq: Three times a day (TID) | ORAL | 0 refills | Status: DC

## 2019-01-07 MED ORDER — OXYCODONE-ACETAMINOPHEN 5-325 MG PO TABS: 1.0000 | ORAL_TABLET | Freq: Four times a day (QID) | ORAL | 0 refills | Status: DC | PRN

## 2019-01-07 MED ORDER — PROMETHAZINE HCL 25 MG PO TABS: 25.0000 mg | ORAL_TABLET | Freq: Three times a day (TID) | ORAL | 0 refills | Status: AC | PRN

## 2019-01-07 MED ORDER — OXYCODONE-ACETAMINOPHEN 5-325 MG PO TABS: 1.0000 | ORAL_TABLET | Freq: Four times a day (QID) | ORAL | 0 refills | Status: AC | PRN

## 2019-01-07 NOTE — Telephone Encounter (Signed)
Asking about completion of her FMLA paper work. Please call when complete.

## 2019-01-07 NOTE — Patient Instructions (Signed)

## 2019-01-07 NOTE — Progress Notes (Signed)
Subjective: 43 year old female presents the office today for further surgical consultation.  She is scheduled for also bunionectomy with screw fixation on the right foot on Wednesday.  Not seen there is November surgery presents today for further evaluation prior to surgery.  She said that she still getting pain on the bunion area but over the last few months she states that she has some plantar fasciitis issues.  Clinically he has resolved and she is getting pain more to the arch of the foot.  No recent injury. Denies any systemic complaints such as fevers, chills, nausea, vomiting. No acute changes since last appointment, and no other complaints at this time.   Objective: AAO x3, NAD DP/PT pulses palpable bilaterally, CRT less than 3 seconds Bony deformities are still present there is tenderness along the bunion site.  There is no crepitation MPJ range of motion is no first ray hypermobility present. Some mild discomfort in the medial band of the plantar fascia the arch of the foot distally.  No significant pain on insertion.  Achilles tendon appears intact. No open lesions or pre-ulcerative lesions.  No pain with calf compression, swelling, warmth, erythema  Assessment: Right foot bunion, plantar fasciitis  Plan: -All treatment options discussed with the patient including all alternatives, risks, complications.  -New x-rays obtained reviewed.  Moderate bunion deformities present.  No evidence of acute fracture. -I again discussed the surgery loss of postoperative course.  We can discuss alternatives, risks, complications.  Surgical consent form given should be signed today.  She is aware the risks and she wished to proceed.  I did enter the consent form steroid injection of the plantar fascia today. -Patient encouraged to call the office with any questions, concerns, change in symptoms.   Trula Slade DPM

## 2019-01-07 NOTE — Telephone Encounter (Signed)
SURGERY WAS AUTHORIZED: Authorization number is [id number].  COVERAGE STATUS  1-1 Code  Description  Coverage Status DECISION DATE    [facility]  Coverage determination is reflected for the facility admission and is not a guarantee of payment for ongoing services. Covered/Approved 01/04/2019  1 [id number] Correction, hallux valgus (bunionectomy) more  Covered/Approved 01/04/2019

## 2019-01-08 ENCOUNTER — Telehealth: Payer: Self-pay

## 2019-01-08 NOTE — Telephone Encounter (Signed)
"  I missed your call."  I left you two messages.  I was returning your call.  You will not have a Covid-19.  They do not require it unless they intubating you.  They have inquired if you are having any symptoms.  "I already spoke to someone about it.  No, I am not having any symptoms.  I was just asking because a friend of mine's mother just had surgery and she had to have a test done."

## 2019-01-08 NOTE — Telephone Encounter (Signed)
I left her a message that she will not need a Covid test.  They are intubating, so it's not needed.  I asked her to call me back if she's having any symptoms.

## 2019-01-09 ENCOUNTER — Encounter: Payer: Self-pay | Admitting: Podiatry

## 2019-01-09 DIAGNOSIS — M722 Plantar fascial fibromatosis: Secondary | ICD-10-CM | POA: Diagnosis not present

## 2019-01-09 DIAGNOSIS — M2011 Hallux valgus (acquired), right foot: Secondary | ICD-10-CM | POA: Diagnosis not present

## 2019-01-14 ENCOUNTER — Telehealth: Payer: Self-pay | Admitting: *Deleted

## 2019-01-14 NOTE — Telephone Encounter (Signed)
Called at 6 pm on Thursday July 2nd, 2020 and left a message for the patient to call me back and to see how patient is doing after having surgery on Wednesday July 1st with Dr Jacqualyn Posey. Lattie Haw

## 2019-01-17 ENCOUNTER — Ambulatory Visit (INDEPENDENT_AMBULATORY_CARE_PROVIDER_SITE_OTHER): Payer: Self-pay | Admitting: Podiatry

## 2019-01-17 ENCOUNTER — Ambulatory Visit (INDEPENDENT_AMBULATORY_CARE_PROVIDER_SITE_OTHER): Payer: 59

## 2019-01-17 ENCOUNTER — Other Ambulatory Visit: Payer: Self-pay

## 2019-01-17 VITALS — Temp 97.6°F

## 2019-01-17 DIAGNOSIS — Z09 Encounter for follow-up examination after completed treatment for conditions other than malignant neoplasm: Secondary | ICD-10-CM

## 2019-01-17 DIAGNOSIS — M2011 Hallux valgus (acquired), right foot: Secondary | ICD-10-CM | POA: Diagnosis not present

## 2019-01-17 DIAGNOSIS — M21619 Bunion of unspecified foot: Secondary | ICD-10-CM

## 2019-01-17 NOTE — Progress Notes (Signed)
Subjective:   Patient ID: Mallory Pham, female   DOB: 43 y.o.   MRN: [id number]   HPI Patient presents stating doing very well with surgery and she is very pleased with how she is doing and states she is no longer taking pain medication and is just taking Advil   ROS      Objective:  Physical Exam  Neurovascular status intact negative Homans sign noted with wound edges well coapted right first metatarsal good alignment of the hallux and no crepitus of the joint upon movement.  This patient was operated on by Dr. Carman Ching last week     Assessment:  Doing very well post bunionectomy right injection right     Plan:  H&P x-ray reviewed and sterile dressing reapplied along with continued elevation compression immobilization and reappoint 1 week for reevaluation.  Encouraged to call with any questions concerns which may happen  X-ray dated today indicates the screw is in place the alignment is good with the reduction of the intermetatarsal angle and joint congruence

## 2019-01-22 ENCOUNTER — Other Ambulatory Visit: Payer: Self-pay

## 2019-01-22 ENCOUNTER — Other Ambulatory Visit: Payer: 59

## 2019-01-22 ENCOUNTER — Ambulatory Visit (INDEPENDENT_AMBULATORY_CARE_PROVIDER_SITE_OTHER): Payer: Self-pay | Admitting: Podiatry

## 2019-01-22 DIAGNOSIS — M21619 Bunion of unspecified foot: Secondary | ICD-10-CM

## 2019-01-30 NOTE — Progress Notes (Signed)
Subjective: Mallory Pham is a 43 y.o. is seen today in office s/p right foot Austin bunionectomy.  She says overall she is doing well.  She has some itching incision site she is only taking the pain.  She still wearing the cam boot.  She has no new concerns.  Denies any systemic complaints such as fevers, chills, nausea, vomiting. No calf pain, chest pain, shortness of breath.   Objective: General: No acute distress, AAOx3  DP/PT pulses palpable 2/4, CRT < 3 sec to all digits.  Protective sensation intact. Motor function intact.  RIGHT foot: Incision is well coapted without any evidence of dehiscence and sutures are intact. There is no surrounding erythema, ascending cellulitis, fluctuance, crepitus, malodor, drainage/purulence. There is mild edema around the surgical site. There is no significant pain along the surgical site.  Toes in rectus position.  No pain with MPJ range of motion No other areas of tenderness to bilateral lower extremities.  No other open lesions or pre-ulcerative lesions.  No pain with calf compression, swelling, warmth, erythema.   Assessment and Plan:  Status post right foot Austin bunionectomy, doing well with no complications   -Treatment options discussed including all alternatives, risks, and complications -Incisions healing well with any signs of infection.  Antibiotic ointment and a bandage was applied.  Keep dressing clean dry, intact. -Ice/elevation -Pain medication as needed. -Monitor for any clinical signs or symptoms of infection and DVT/PE and directed to call the office immediately should any occur or go to the ER. -Follow-up as scheduled to remove sutures and for repeat x-rays or sooner if any problems arise. In the meantime, encouraged to call the office with any questions, concerns, change in symptoms.   Celesta Gentile, DPM

## 2019-02-01 ENCOUNTER — Other Ambulatory Visit: Payer: Self-pay

## 2019-02-01 ENCOUNTER — Encounter: Payer: Self-pay | Admitting: Podiatry

## 2019-02-01 ENCOUNTER — Ambulatory Visit (INDEPENDENT_AMBULATORY_CARE_PROVIDER_SITE_OTHER): Payer: 59 | Admitting: Podiatry

## 2019-02-01 ENCOUNTER — Ambulatory Visit (INDEPENDENT_AMBULATORY_CARE_PROVIDER_SITE_OTHER): Payer: 59

## 2019-02-01 VITALS — Temp 98.2°F

## 2019-02-01 DIAGNOSIS — M2011 Hallux valgus (acquired), right foot: Secondary | ICD-10-CM | POA: Diagnosis not present

## 2019-02-01 DIAGNOSIS — Z09 Encounter for follow-up examination after completed treatment for conditions other than malignant neoplasm: Secondary | ICD-10-CM

## 2019-02-06 NOTE — Progress Notes (Signed)
Subjective: Mallory Pham is a 43 y.o. is seen today in office s/p right foot Austin bunionectomy.  She says overall she is doing well and her pain is controlled. She is not taking any pain medication.  She reports that when she lays flat she gets some wheezing.  When she sits up it goes away.  She denies any associated chest pain or shortness of breath.  No radiating pain.  No nausea or vomiting.  No fevers or chills.  Objective: General: No acute distress, AAOx3  DP/PT pulses palpable 2/4, CRT < 3 sec to all digits.  Protective sensation intact. Motor function intact.  RIGHT foot: Incision is well coapted without any evidence of dehiscence and sutures are intact, however there are absorbable sutures.  Incision appears to be healing well.  Minimal edema to the area improved.  No erythema or warmth.  No ascending cellulitis or signs of infection.  There is minimal discomfort to palpation at surgical site.  No pain with MPJ range of motion  No other open lesions or pre-ulcerative lesions.  No pain with calf compression, swelling, warmth, erythema.   Assessment and Plan:  Status post right foot Austin bunionectomy, doing well with no complications   -Treatment options discussed including all alternatives, risks, and complications -Overall she is doing well.  Encourage range of motion exercises.  She states that she when she lays flat on her back when she gets the wound.  She lays on her back because of the boot.  She can wear surgical shoe when she sleeps.  I encouraged her to follow-up with her primary care physician as well.  She denies any symptoms today.  There is no calf pain or signs of DVT.  Symptoms worsen she is to the emergency room.  Continue to ice elevate.  Trula Slade DPM

## 2019-02-15 ENCOUNTER — Encounter: Payer: Self-pay | Admitting: Podiatry

## 2019-02-15 ENCOUNTER — Ambulatory Visit (INDEPENDENT_AMBULATORY_CARE_PROVIDER_SITE_OTHER): Payer: 59 | Admitting: Podiatry

## 2019-02-15 ENCOUNTER — Other Ambulatory Visit: Payer: Self-pay

## 2019-02-15 ENCOUNTER — Ambulatory Visit (INDEPENDENT_AMBULATORY_CARE_PROVIDER_SITE_OTHER): Payer: 59

## 2019-02-15 VITALS — Temp 97.9°F

## 2019-02-15 DIAGNOSIS — M2011 Hallux valgus (acquired), right foot: Secondary | ICD-10-CM | POA: Diagnosis not present

## 2019-02-15 DIAGNOSIS — Z09 Encounter for follow-up examination after completed treatment for conditions other than malignant neoplasm: Secondary | ICD-10-CM

## 2019-02-15 NOTE — Progress Notes (Signed)
Subjective: Mallory Pham is a 43 y.o. is seen today in office s/p right foot Austin bunionectomy.  Overall she states that she is doing much better.  Her breathing has been better since wearing a surgical shoe at night.  Her swelling is improved.  The scab did come off.  She has noticed that 1 stitch on the incision.  She denies any fevers, chills, nausea, vomiting.  No calf pain, chest pain, shortness of breath.  Objective: General: No acute distress, AAOx3  DP/PT pulses palpable 2/4, CRT < 3 sec to all digits.  Protective sensation intact. Motor function intact.  RIGHT foot: Incision is well coapted without any evidence of dehiscence and a scar is formed.  There is a single dissolvable sutures on the proximal aspect which is intact and removed today.  No drainage.  Decreased edema.  Is no erythema or warmth.  No pain with MPJ range of motion or mild restriction. No other open lesions or pre-ulcerative lesions.  No pain with calf compression, swelling, warmth, erythema.   Assessment and Plan:  Status post right foot Austin bunionectomy, doing well with no complications   -Treatment options discussed including all alternatives, risks, and complications -X-rays were obtained and reviewed.  Increased consolidation across the osteotomy site hardware intact. -She can start to transition to an surgical shoe more as able.  However she can also wear the boot.  We will start physical therapy next week.  As she starts to progress with physical therapy she can transition to regular shoe.  Compression anklet dispensed.  Ice elevation still.  Trula Slade DPM

## 2019-02-18 ENCOUNTER — Telehealth: Payer: Self-pay | Admitting: *Deleted

## 2019-02-18 DIAGNOSIS — Z09 Encounter for follow-up examination after completed treatment for conditions other than malignant neoplasm: Secondary | ICD-10-CM

## 2019-02-18 DIAGNOSIS — M21619 Bunion of unspecified foot: Secondary | ICD-10-CM

## 2019-02-18 DIAGNOSIS — M2011 Hallux valgus (acquired), right foot: Secondary | ICD-10-CM

## 2019-02-18 NOTE — Telephone Encounter (Signed)
Required form delivered to [facility] - In-office.

## 2019-02-21 NOTE — Addendum Note (Signed)
Addended by: Cranford Mon R on: 02/21/2019 09:17 AM   Modules accepted: Orders

## 2019-03-08 ENCOUNTER — Ambulatory Visit (INDEPENDENT_AMBULATORY_CARE_PROVIDER_SITE_OTHER): Payer: 59

## 2019-03-08 ENCOUNTER — Encounter: Payer: Self-pay | Admitting: Podiatry

## 2019-03-08 ENCOUNTER — Other Ambulatory Visit: Payer: Self-pay

## 2019-03-08 ENCOUNTER — Ambulatory Visit (INDEPENDENT_AMBULATORY_CARE_PROVIDER_SITE_OTHER): Payer: 59 | Admitting: Podiatry

## 2019-03-08 DIAGNOSIS — M2011 Hallux valgus (acquired), right foot: Secondary | ICD-10-CM | POA: Diagnosis not present

## 2019-03-08 DIAGNOSIS — Z09 Encounter for follow-up examination after completed treatment for conditions other than malignant neoplasm: Secondary | ICD-10-CM

## 2019-03-08 NOTE — Progress Notes (Signed)
Subjective: Mallory Pham is a 43 y.o. is seen today in office s/p right foot Austin bunionectomy.  Overall she states that she is doing much better.  Overall she is doing better.  She has some sensitivity overlying the bunion on the incision site but otherwise been doing well.  Swelling is also improved.  She is doing physical therapy the range of motion has been getting better.  She denies any fevers, chills, nausea, vomiting.  No calf pain, chest pain, shortness of breath.  Objective: General: No acute distress, AAOx3  DP/PT pulses palpable 2/4, CRT < 3 sec to all digits.  Protective sensation intact. Motor function intact.  RIGHT foot: Incision is well coapted without any evidence of dehiscence and a scar is formed.  There is decreased edema to the area there is no erythema or warmth.  Improved range of motion of first MPJ.  No crepitation.  No significant discomfort I can palpate today. No other open lesions or pre-ulcerative lesions.  No pain with calf compression, swelling, warmth, erythema.   Assessment and Plan:  Status post right foot Austin bunionectomy, doing well with no complications   -Treatment options discussed including all alternatives, risks, and complications -X-rays were obtained and reviewed.  Increased consolidation across the osteotomy site hardware intact. -She can start to transition to regular shoe as able.  Continue range of motion, physical therapy.  Ice the area.  Compression anklet.  Return in about 4 weeks (around 04/05/2019).  Trula Slade DPM

## 2019-04-05 ENCOUNTER — Other Ambulatory Visit: Payer: Self-pay

## 2019-04-05 ENCOUNTER — Encounter: Payer: Self-pay | Admitting: Podiatry

## 2019-04-05 ENCOUNTER — Ambulatory Visit (INDEPENDENT_AMBULATORY_CARE_PROVIDER_SITE_OTHER): Payer: 59 | Admitting: Podiatry

## 2019-04-05 ENCOUNTER — Ambulatory Visit (INDEPENDENT_AMBULATORY_CARE_PROVIDER_SITE_OTHER): Payer: 59

## 2019-04-05 DIAGNOSIS — M2011 Hallux valgus (acquired), right foot: Secondary | ICD-10-CM

## 2019-04-05 DIAGNOSIS — Z09 Encounter for follow-up examination after completed treatment for conditions other than malignant neoplasm: Secondary | ICD-10-CM

## 2019-04-05 NOTE — Progress Notes (Signed)
Subjective: Mallory Pham is a 43 y.o. is seen today in office s/p right foot Austin bunionectomy preformed on 01/09/2019. She states that she is doing well.  She states that over the last couple days she is starting some intermittent pain to the bottom of her big toe but no pain on the surgical site.  She is wearing a regular shoe.  She is to work on the range of motion of the big toe joint.  She has no other concerns.  No pain or actual surgical site. She denies any fevers, chills, nausea, vomiting.  No calf pain, chest pain, shortness of breath.  Objective: General: No acute distress, AAOx3  DP/PT pulses palpable 2/4, CRT < 3 sec to all digits.  Protective sensation intact. Motor function intact.  RIGHT foot: Incision is well coapted without any evidence of dehiscence and a scar is formed.  There is no tenderness to palpation at surgical site itself.  Subjectively she gets tenderness in the plantar aspect of the right hallux at the IPJ but no tenderness identified today.  There is no edema to this area.  There is no signs of infection noted in the surgical site.  Mild improvement in range of motion of first MPJ but there is still some restriction.  No crepitation or pain with MPJ range of motion. No other open lesions or pre-ulcerative lesions.  No pain with calf compression, swelling, warmth, erythema.   Assessment and Plan:  Status post right foot Austin bunionectomy  -Treatment options discussed including all alternatives, risks, and complications -X-rays were obtained and reviewed.  Increased consolidation across the osteotomy site hardware intact.  Accessory bone present plantar hallux IPJ -Her painful hallux is due to compensation in the exacerbation.  This just recently started as well.  I will continue with supportive shoes and offloading. -Continue with MPJ range of motion. -Follow up in 4 weeks or sooner if any issues with her  Trula Slade DPM

## 2019-04-09 ENCOUNTER — Other Ambulatory Visit: Payer: Self-pay | Admitting: Family Medicine

## 2019-04-09 DIAGNOSIS — D241 Benign neoplasm of right breast: Secondary | ICD-10-CM

## 2019-04-29 ENCOUNTER — Telehealth: Payer: Self-pay | Admitting: *Deleted

## 2019-04-29 NOTE — Telephone Encounter (Signed)
Left message informing pt that if her toe was still hurting she should keep her 05/03/2019 appt.

## 2019-04-29 NOTE — Telephone Encounter (Signed)
Pt called states she is not having a lot of pain, but feels it when she walks, nothing she needs to take tylenol for . I told pt that she should come in and discuss with the doctor, there may be other treatment options.

## 2019-04-29 NOTE — Telephone Encounter (Signed)
Pt states she has an appt 05/03/2019 and wanted to know if she should keep it, her toe still hurts.

## 2019-05-03 ENCOUNTER — Ambulatory Visit: Payer: 59 | Admitting: Podiatry

## 2019-05-16 ENCOUNTER — Other Ambulatory Visit: Payer: Self-pay | Admitting: Obstetrics and Gynecology

## 2019-05-16 DIAGNOSIS — D241 Benign neoplasm of right breast: Secondary | ICD-10-CM

## 2019-05-20 ENCOUNTER — Other Ambulatory Visit: Payer: Self-pay | Admitting: Obstetrics and Gynecology

## 2019-05-20 ENCOUNTER — Ambulatory Visit
Admission: RE | Admit: 2019-05-20 | Discharge: 2019-05-20 | Disposition: A | Discharge status: Home / Self Care | Payer: 59 | Source: Ambulatory Visit | Attending: Family Medicine | Admitting: Family Medicine

## 2019-05-20 ENCOUNTER — Other Ambulatory Visit: Payer: Self-pay

## 2019-05-20 DIAGNOSIS — D241 Benign neoplasm of right breast: Secondary | ICD-10-CM

## 2019-11-05 ENCOUNTER — Ambulatory Visit: Payer: 59 | Admitting: Podiatry

## 2019-11-22 ENCOUNTER — Other Ambulatory Visit: Payer: Self-pay

## 2019-11-22 ENCOUNTER — Ambulatory Visit
Admission: RE | Admit: 2019-11-22 | Discharge: 2019-11-22 | Disposition: A | Discharge status: Home / Self Care | Payer: 59 | Source: Ambulatory Visit | Attending: Obstetrics and Gynecology | Admitting: Obstetrics and Gynecology

## 2019-11-22 DIAGNOSIS — D241 Benign neoplasm of right breast: Secondary | ICD-10-CM

## 2020-04-29 ENCOUNTER — Other Ambulatory Visit (HOSPITAL_BASED_OUTPATIENT_CLINIC_OR_DEPARTMENT_OTHER): Payer: Self-pay

## 2020-04-29 DIAGNOSIS — R0681 Apnea, not elsewhere classified: Secondary | ICD-10-CM

## 2020-04-29 DIAGNOSIS — G4710 Hypersomnia, unspecified: Secondary | ICD-10-CM

## 2020-04-29 DIAGNOSIS — R0683 Snoring: Secondary | ICD-10-CM

## 2020-05-22 ENCOUNTER — Ambulatory Visit (HOSPITAL_BASED_OUTPATIENT_CLINIC_OR_DEPARTMENT_OTHER): Payer: 59 | Attending: Internal Medicine | Admitting: Internal Medicine

## 2020-05-22 ENCOUNTER — Other Ambulatory Visit: Payer: Self-pay

## 2020-05-22 DIAGNOSIS — R0683 Snoring: Secondary | ICD-10-CM

## 2020-05-22 DIAGNOSIS — R400 Somnolence: Secondary | ICD-10-CM | POA: Diagnosis present

## 2020-05-22 DIAGNOSIS — R0602 Shortness of breath: Secondary | ICD-10-CM | POA: Diagnosis not present

## 2020-05-22 DIAGNOSIS — R0681 Apnea, not elsewhere classified: Secondary | ICD-10-CM

## 2020-05-22 DIAGNOSIS — G4710 Hypersomnia, unspecified: Secondary | ICD-10-CM

## 2020-05-24 NOTE — Procedures (Signed)
   NAME: Mallory Pham DATE OF BIRTH:  [birth year] MEDICAL RECORD NUMBER [id number]  LOCATION: [facility]  PHYSICIAN: Marius Ditch  DATE OF STUDY: 05/22/2020  SLEEP STUDY TYPE: Nocturnal Polysomnogram               REFERRING PHYSICIAN: Marius Ditch, MD  INDICATION FOR STUDY: excessive daytime sleepiness, awakens gasping for breath  EPWORTH SLEEPINESS SCORE:  10 HEIGHT: 5\' 1"  (154.9 cm)  WEIGHT: 205 lb (93 kg)    Body mass index is 38.73 kg/m.  NECK SIZE: 15 in.  MEDICATIONS Patient self administered medications include: N/A. Medications administered during study include No sleep medicine administered.  SLEEP STUDY TECHNIQUE A multi-channel overnight Polysomnography study was performed. The channels recorded and monitored were central and occipital EEG, electrooculogram (EOG), submentalis EMG (chin), nasal and oral airflow, thoracic and abdominal wall motion, anterior tibialis EMG, snore microphone, electrocardiogram, and a pulse oximetry.  TECHNICAL COMMENTS Comments added by Technician: Patient had difficulty initiating sleep. Comments added by Scorer: N/A  SLEEP ARCHITECTURE The study was initiated at 10:05:27 PM and terminated at 4:50:37 AM. The total recorded time was 405.2 minutes. EEG confirmed total sleep time was 378.5 minutes yielding a sleep efficiency of 93.4%%. Sleep onset after lights out was 4.5 minutes with a REM latency of 136.0 minutes. The patient spent 1.7%% of the night in stage N1 sleep, 79.1%% in stage N2 sleep, 0.0%% in stage N3 and 19.2% in REM. Wake after sleep onset (WASO) was 22.2 minutes. The Arousal Index was 5.4/hour.  RESPIRATORY PARAMETERS There were a total of 19 respiratory disturbances out of which 0 were apneas ( 0 obstructive, 0 mixed, 0 central) and 19 hypopneas. The apnea/hypopnea index (AHI) was 3.0 events/hour. The central sleep apnea index was 0.0 events/hour. The REM AHI was 9.1 events/hour and NREM AHI was 1.6  events/hour. The supine AHI was 3.0 events/hour and the non supine AHI was 0 events/hour. The Respiratory Disturbance Index (RDI) was 5.7 events/hour overall and 17.4 events/hour in REM sleep. Respiratory disturbances were associated with oxygen desaturation down to a nadir of 88.0% during sleep. The mean oxygen saturation during the study was 95.6%. The cumulative time under 88% oxygen saturation was 0.1 minutes.  LEG MOVEMENT DATA The total leg movements were 0 with a resulting leg movement index of 0.0/hr . Associated arousal with leg movement index was 0.0/hr.  CARDIAC DATA The underlying cardiac rhythm was most consistent with sinus rhythm. Mean heart rate during sleep was 66.6 bpm. Additional rhythm abnormalities include None.  IMPRESSIONS - No Significant sleep disordered breathing by AHI; borderline significance by RDI  DIAGNOSIS - Excessive daytime sleepiness  RECOMMENDATIONS - There is no indication for treatment of sleep disordered breathing based on this study.   Marius Ditch Sleep specialist. American Board of Internal Medicine  ELECTRONICALLY SIGNED ON:  05/24/2020, 9:23 PM [place] PH: [phone]   FX: [phone] [place]

## 2020-12-29 ENCOUNTER — Other Ambulatory Visit: Payer: Self-pay | Admitting: Obstetrics and Gynecology

## 2020-12-29 DIAGNOSIS — Z1231 Encounter for screening mammogram for malignant neoplasm of breast: Secondary | ICD-10-CM

## 2020-12-30 ENCOUNTER — Other Ambulatory Visit: Payer: Self-pay | Admitting: Obstetrics and Gynecology

## 2020-12-30 DIAGNOSIS — N6001 Solitary cyst of right breast: Secondary | ICD-10-CM

## 2021-02-09 ENCOUNTER — Other Ambulatory Visit: Payer: Self-pay

## 2021-02-09 ENCOUNTER — Ambulatory Visit
Admission: RE | Admit: 2021-02-09 | Discharge: 2021-02-09 | Disposition: A | Discharge status: Home / Self Care | Payer: 59 | Source: Ambulatory Visit | Attending: Obstetrics and Gynecology | Admitting: Obstetrics and Gynecology

## 2021-02-09 DIAGNOSIS — N6001 Solitary cyst of right breast: Secondary | ICD-10-CM

## 2022-02-14 ENCOUNTER — Other Ambulatory Visit: Payer: Self-pay | Admitting: Home Modifications

## 2022-02-14 DIAGNOSIS — E039 Hypothyroidism, unspecified: Secondary | ICD-10-CM

## 2022-02-15 ENCOUNTER — Ambulatory Visit
Admission: RE | Admit: 2022-02-15 | Discharge: 2022-02-15 | Disposition: A | Discharge status: Home / Self Care | Payer: 59 | Source: Ambulatory Visit | Attending: Home Modifications | Admitting: Home Modifications

## 2022-02-15 DIAGNOSIS — E039 Hypothyroidism, unspecified: Secondary | ICD-10-CM

## 2022-10-23 IMAGING — MG DIGITAL DIAGNOSTIC BILAT W/ TOMO W/ CAD
8 series · 8 of 24 positions shown · non-contrast
Comparison: Previous exam(s).

CLINICAL DATA: Patient for follow-up of probably benign right
breast mass.

EXAM:
DIGITAL DIAGNOSTIC BILATERAL MAMMOGRAM WITH TOMOSYNTHESIS AND CAD;
ULTRASOUND RIGHT BREAST LIMITED
TECHNIQUE: Bilateral digital diagnostic mammography and breast tomosynthesis
was performed. The images were evaluated with computer-aided
detection.; Targeted ultrasound examination of the right breast was
performed

[R MLO synth-2D]
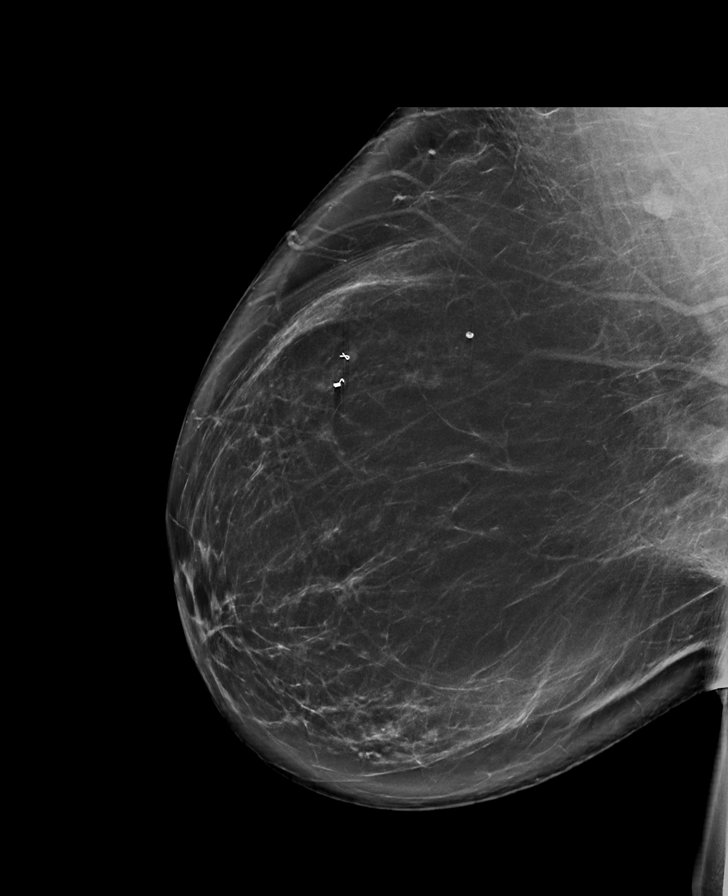

[L CC synth-2D]
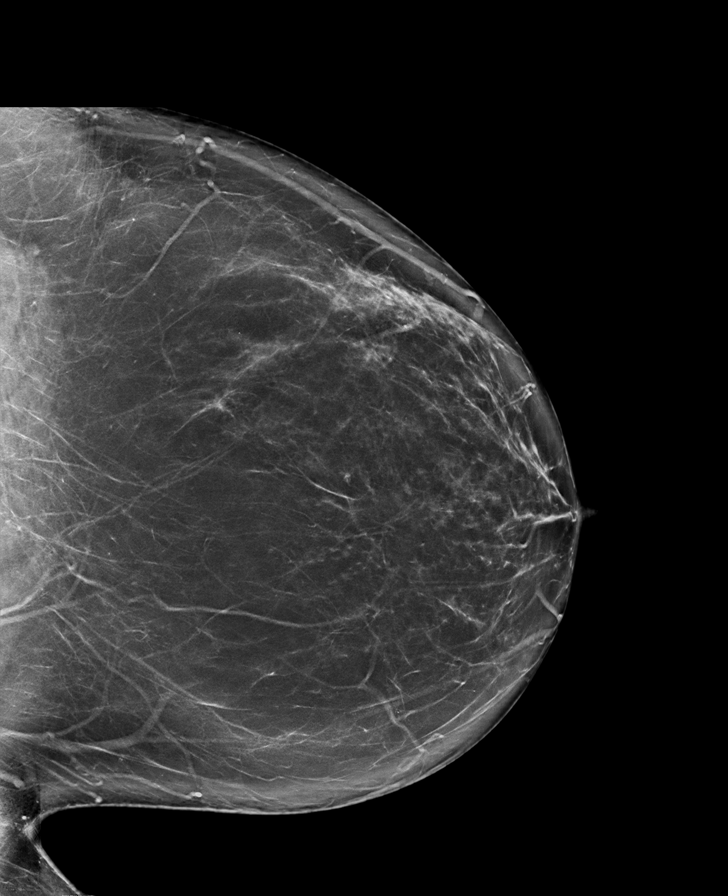

[R CC synth-2D]
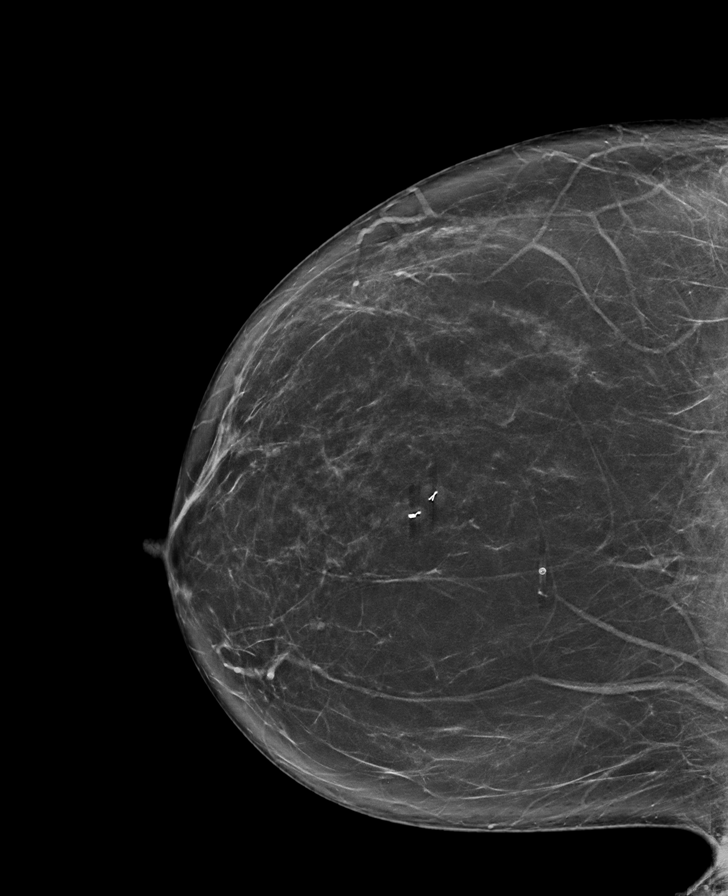

[L MLO synth-2D]
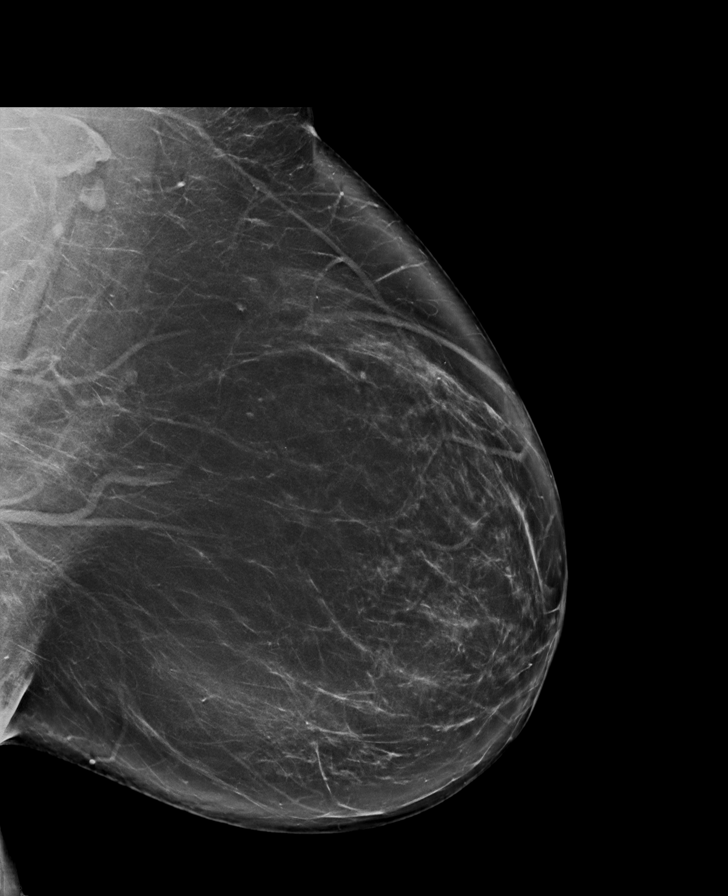

[R MLO tomo · tomo slice 50/99.0]
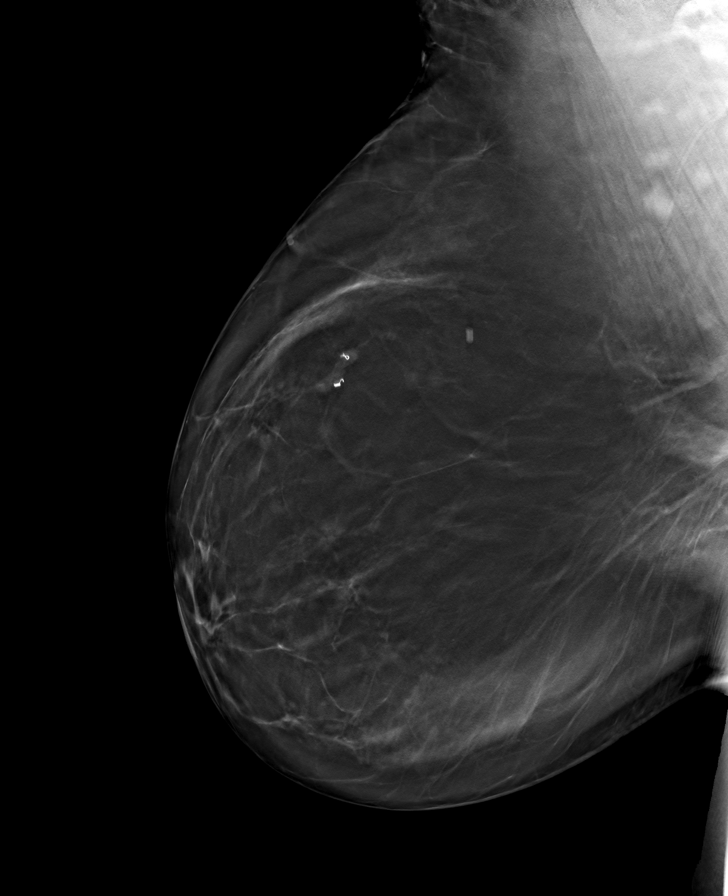

[L CC tomo · tomo slice 45/88.0]
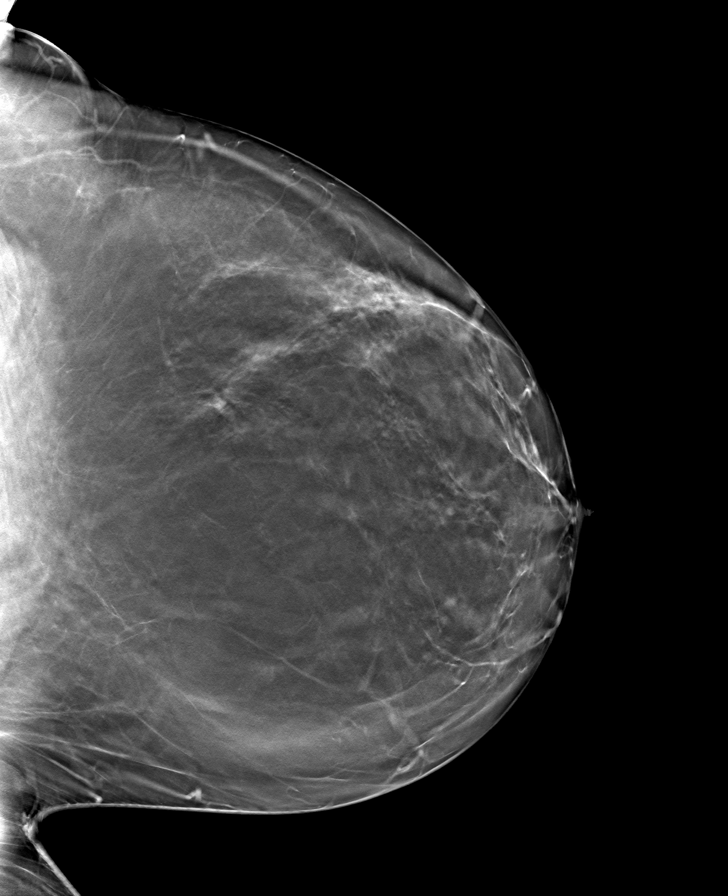

[L MLO tomo · tomo slice 49/97.0]
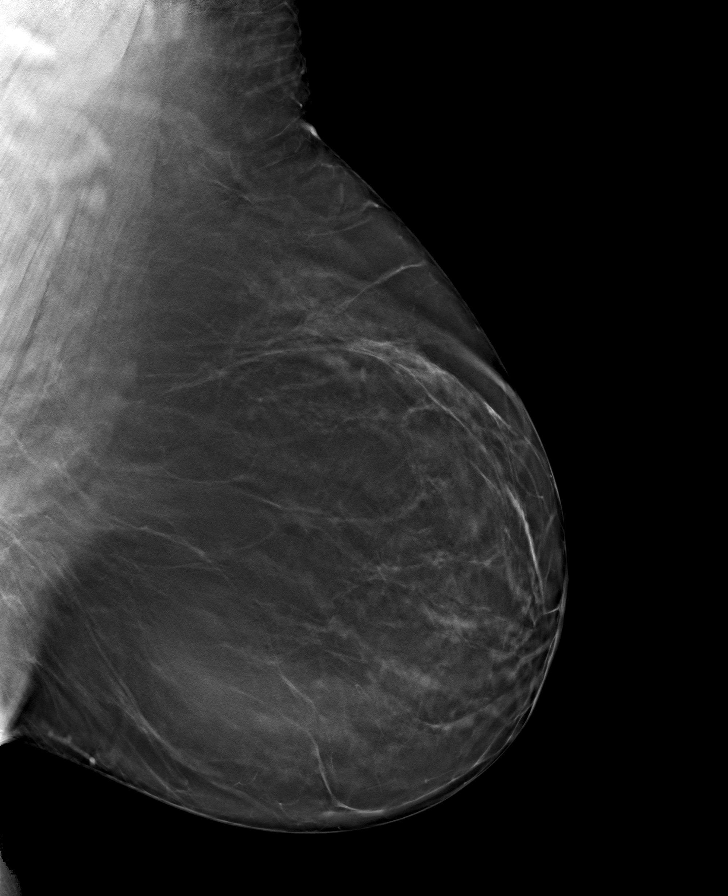

[R CC tomo · tomo slice 43/85.0]
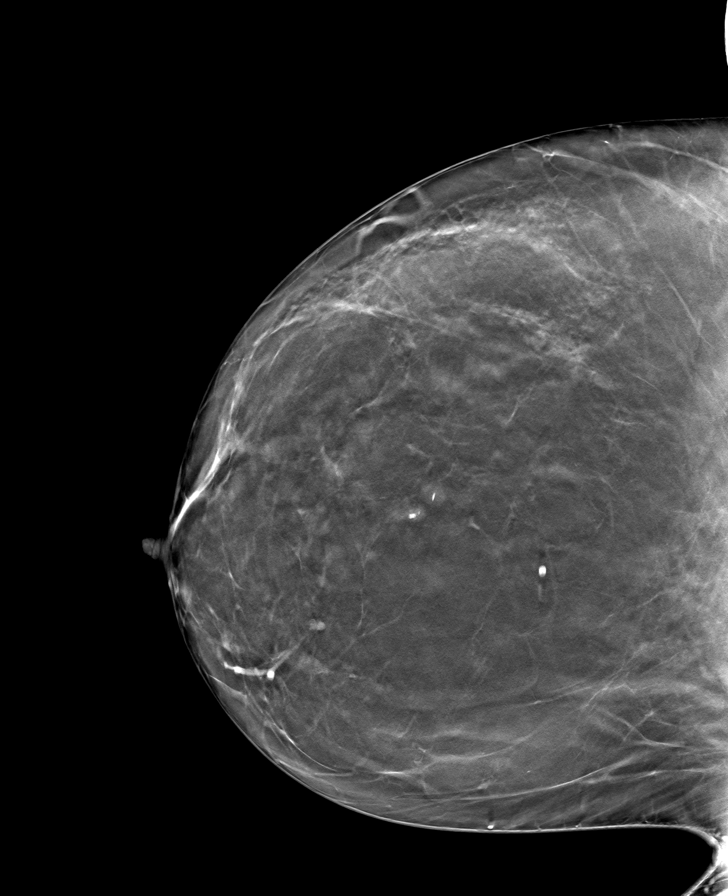

[8 of 24 positions shown; findings below may reference images not displayed]

ACR Breast Density Category b: There are scattered areas of
fibroglandular density.
FINDINGS: Grossly unchanged small lobular mass within the anterior slightly
medial right breast. No new masses, calcifications or distortion
identified within either breast. Redemonstrated biopsy marking clips
within the right breast.

Targeted ultrasound is performed, showing a stable 4 x 2 x 3 mm
cluster of cysts right breast 1 o'clock position 2 cm from nipple,
compatible with benign process.
IMPRESSION: Benign right breast cluster of cysts. No mammographic evidence for
malignancy.

RECOMMENDATION:
Screening mammogram in one year.(Code:G5-0-EMU)

I have discussed the findings and recommendations with the patient.
If applicable, a reminder letter will be sent to the patient
regarding the next appointment.

BI-RADS CATEGORY  2: Benign.

## 2023-11-09 ENCOUNTER — Other Ambulatory Visit (HOSPITAL_COMMUNITY): Payer: Self-pay | Admitting: Nurse Practitioner

## 2023-11-09 DIAGNOSIS — E785 Hyperlipidemia, unspecified: Secondary | ICD-10-CM

## 2023-11-17 ENCOUNTER — Other Ambulatory Visit (HOSPITAL_COMMUNITY)

## 2023-11-22 ENCOUNTER — Encounter (HOSPITAL_BASED_OUTPATIENT_CLINIC_OR_DEPARTMENT_OTHER): Payer: Self-pay

## 2023-11-22 ENCOUNTER — Other Ambulatory Visit (HOSPITAL_BASED_OUTPATIENT_CLINIC_OR_DEPARTMENT_OTHER): Payer: Self-pay

## 2024-01-23 ENCOUNTER — Other Ambulatory Visit: Payer: Self-pay | Admitting: Obstetrics and Gynecology

## 2024-01-23 DIAGNOSIS — R928 Other abnormal and inconclusive findings on diagnostic imaging of breast: Secondary | ICD-10-CM

## 2024-02-02 ENCOUNTER — Ambulatory Visit
Admission: RE | Admit: 2024-02-02 | Discharge: 2024-02-02 | Disposition: A | Discharge status: Home / Self Care | Payer: Self-pay | Source: Ambulatory Visit | Attending: Obstetrics and Gynecology | Admitting: Obstetrics and Gynecology

## 2024-02-02 DIAGNOSIS — R928 Other abnormal and inconclusive findings on diagnostic imaging of breast: Secondary | ICD-10-CM

## 2024-06-05 ENCOUNTER — Ambulatory Visit (HOSPITAL_BASED_OUTPATIENT_CLINIC_OR_DEPARTMENT_OTHER)
Admission: RE | Admit: 2024-06-05 | Discharge: 2024-06-05 | Disposition: A | Discharge status: Home / Self Care | Payer: Self-pay | Source: Ambulatory Visit | Attending: Nurse Practitioner | Admitting: Nurse Practitioner

## 2024-06-05 DIAGNOSIS — E785 Hyperlipidemia, unspecified: Secondary | ICD-10-CM | POA: Insufficient documentation
